# Patient Record
Sex: Female | Born: 1950 | Race: Black or African American | Hispanic: No | Smoking: Former smoker
Health system: Southern US, Community
[De-identification: ages and names within clinical notes are randomized; demographics above are authoritative.]

## PROBLEM LIST (undated history)

## (undated) DIAGNOSIS — M109 Gout, unspecified: Secondary | ICD-10-CM

## (undated) DIAGNOSIS — E785 Hyperlipidemia, unspecified: Secondary | ICD-10-CM

## (undated) DIAGNOSIS — I10 Essential (primary) hypertension: Secondary | ICD-10-CM

## (undated) HISTORY — PX: OTHER SURGICAL HISTORY: SHX169

---

## 2006-12-03 ENCOUNTER — Emergency Department: Payer: Self-pay | Admitting: Emergency Medicine

## 2008-01-23 ENCOUNTER — Emergency Department: Payer: Self-pay | Admitting: Emergency Medicine

## 2009-09-08 ENCOUNTER — Ambulatory Visit: Payer: Self-pay

## 2009-09-30 ENCOUNTER — Ambulatory Visit: Payer: Self-pay | Admitting: General Surgery

## 2009-10-06 ENCOUNTER — Ambulatory Visit: Payer: Self-pay | Admitting: General Surgery

## 2010-09-21 ENCOUNTER — Ambulatory Visit: Payer: Self-pay

## 2010-11-15 ENCOUNTER — Ambulatory Visit: Payer: Self-pay | Admitting: Family

## 2011-09-21 ENCOUNTER — Ambulatory Visit: Payer: Self-pay

## 2011-09-28 ENCOUNTER — Emergency Department: Payer: Self-pay | Admitting: Emergency Medicine

## 2011-12-20 ENCOUNTER — Emergency Department: Payer: Self-pay | Admitting: *Deleted

## 2012-10-31 ENCOUNTER — Ambulatory Visit: Payer: Self-pay

## 2014-07-16 ENCOUNTER — Ambulatory Visit: Payer: Self-pay

## 2015-09-28 ENCOUNTER — Emergency Department
Admission: EM | Admit: 2015-09-28 | Discharge: 2015-09-28 | Disposition: A | Discharge status: Home / Self Care | Payer: Self-pay | Attending: Emergency Medicine | Admitting: Emergency Medicine

## 2015-09-28 ENCOUNTER — Emergency Department: Payer: Self-pay

## 2015-09-28 ENCOUNTER — Encounter: Payer: Self-pay | Admitting: Emergency Medicine

## 2015-09-28 DIAGNOSIS — I10 Essential (primary) hypertension: Secondary | ICD-10-CM | POA: Insufficient documentation

## 2015-09-28 DIAGNOSIS — I8390 Asymptomatic varicose veins of unspecified lower extremity: Secondary | ICD-10-CM

## 2015-09-28 DIAGNOSIS — I8392 Asymptomatic varicose veins of left lower extremity: Secondary | ICD-10-CM | POA: Insufficient documentation

## 2015-09-28 HISTORY — DX: Essential (primary) hypertension: I10

## 2015-09-28 NOTE — ED Provider Notes (Signed)
[facility] Emergency Department Provider Note  ____________________________________________  Time seen: Approximately 2:16 PM  I have reviewed the triage vital signs and the nursing notes.   HISTORY  Chief Complaint Leg Swelling    HPI Wanda Fernandez is a 65 y.o. female a previous history of high blood pressure currently on Norvasc and atenolol.  For about the last 2 weeks she isa small area in the left calf is a little swollen, occasionally slightly bluish in color. She is not having any pain. She has not had a recent surgery and never has a history of blood clot. No shortness of breath chest pain or other concerns.  No weakness numbness or cool foot or toes.   Past Medical History  Diagnosis Date  . Hypertension     There are no active problems to display for this patient.   History reviewed. No pertinent past surgical history.  No current outpatient prescriptions on file.  Allergies Review of patient's allergies indicates no known allergies.  No family history on file.  Social History Social History  Substance Use Topics  . Smoking status: None  . Smokeless tobacco: None  . Alcohol Use: None    Review of Systems Constitutional: No fever/chills Eyes: No visual changes. ENT: No sore throat. Cardiovascular: Denies chest pain. Respiratory: Denies shortness of breath. Gastrointestinal: No abdominal pain.  No nausea, no vomiting.  No diarrhea.  No constipation. Genitourinary: Negative for dysuria. Musculoskeletal: Negative for back pain. Skin: Negative for rash. Neurological: Negative for headaches, focal weakness or numbness.  10-point ROS otherwise negative.  ____________________________________________   PHYSICAL EXAM:  VITAL SIGNS: ED Triage Vitals  Enc Vitals Group     BP 09/28/15 1013 160/118 mmHg     Pulse Rate 09/28/15 1013 60     Resp 09/28/15 1013 16     Temp 09/28/15 1013 97.9 F (36.6 C)     Temp Source  09/28/15 1013 Oral     SpO2 09/28/15 1013 99 %     Weight 09/28/15 1013 210 lb (95.255 kg)     Height 09/28/15 1013  (1.575 m)     Head Cir --      Peak Flow --      Pain Score 09/28/15 1015 0     Pain Loc --      Pain Edu? --      Excl. in GC? --    Constitutional: Alert and oriented. Well appearing and in no acute distress. Very pleasant. Eyes: Conjunctivae are normal. PERRL. EOMI. Head: Atraumatic. Nose: No congestion/rhinnorhea. Mouth/Throat: Mucous membranes are moist.  Oropharynx non-erythematous. Neck: No stridor.   Cardiovascular: Normal rate, regular rhythm. Grossly normal heart sounds.  Good peripheral circulation. Respiratory: Normal respiratory effort.  No retractions. Lungs CTAB. Gastrointestinal: Soft and nontender. No distention.  Musculoskeletal:  Lower Extremities  No edema. Normal DP/PT pulses bilateral with good cap refill.  Normal neuro-motor function lower extremities bilateral.  RIGHT Right lower extremity demonstrates normal strength, good use of all muscles. No edema bruising or contusions of the right hip, right knee, right ankle. Full range of motion of the right lower extremity without pain. No pain on axial loading. No evidence of trauma.  LEFT Left lower extremity demonstrates normal strength, good use of all muscles. No edema bruising or contusions of the hip,  knee, ankle. Full range of motion of the left lower extremity without pain. No pain on axial loading. No evidence of trauma. The patient does have a  small area about the size of a half-dollar that is slightly indurated, but not erythematous or tender. It has a very slight bluish hue to it overlying the anterior medial aspect of the left lower leg, and appears to be consistent with a probable varicose-type pain or small cyst.    Neurologic:  Normal speech and language. No gross focal neurologic deficits are appreciated. No gait instability. Skin:  Skin is warm, dry and intact. No rash  noted. Psychiatric: Mood and affect are normal. Speech and behavior are normal.  ____________________________________________   LABS (all labs ordered are listed, but only abnormal results are displayed)  Labs Reviewed - No data to display ____________________________________________  EKG   ____________________________________________  RADIOLOGY  venous systems from the level of the common femoral vein and including the common femoral, femoral, profunda femoral, popliteal and calf veins including the posterior tibial, peroneal and gastrocnemius veins when visible. The superficial great saphenous vein was also interrogated. Spectral Doppler was utilized to evaluate flow at rest and with distal augmentation maneuvers in the common femoral, femoral and popliteal veins.  COMPARISON: None.  FINDINGS: Contralateral Common Femoral Vein: Respiratory phasicity is normal and symmetric with the symptomatic side. No evidence of thrombus. Normal compressibility.  Common Femoral Vein: No evidence of thrombus. Normal compressibility, respiratory phasicity and response to augmentation.  Saphenofemoral Junction: No evidence of thrombus. Normal compressibility and flow on color Doppler imaging.  Profunda Femoral Vein: No evidence of thrombus. Normal compressibility and flow on color Doppler imaging.  Femoral Vein: No evidence of thrombus. Normal compressibility, respiratory phasicity and response to augmentation.  Popliteal Vein: No evidence of thrombus. Normal compressibility, respiratory phasicity and response to augmentation.  Calf Veins: No evidence of thrombus. Normal compressibility and flow on color Doppler imaging.  Superficial Great Saphenous Vein: No evidence of thrombus. Normal compressibility and flow on color Doppler imaging.  Venous Reflux: None.  Other Findings: None.  IMPRESSION: No evidence of deep venous  thrombosis.  ____________________________________________   PROCEDURES  Procedure(s) performed: None  Critical Care performed: No  ____________________________________________   INITIAL IMPRESSION / ASSESSMENT AND PLAN / ED COURSE  Pertinent labs & imaging results that were available during my care of the patient were reviewed by me and considered in my medical decision making (see chart for details).  Patient noted to have mild to moderate hypertension. She is following up with her doctor on May 10 regarding this and currently on treatment. No symptoms of hypertension.   Regarding her left lower leg, exam seems to be most consistent with a small soft tissue cyst versus varicose vein. She does not appear to have any evidence of DVT and her ultrasound does not demonstrate DVT. There is no evidence or signs of infection, ischemic change, or neurologic/sensory deficit.   Return precautions given. Patient will follow-up closely with her doctor which she has appointment on May 10.  ____________________________________________   FINAL CLINICAL IMPRESSION(S) / ED DIAGNOSES  Final diagnoses:  Varicose vein of leg      Sharyn CreamerMark Raley Novicki, MD 09/28/15 1420

## 2015-09-28 NOTE — Discharge Instructions (Signed)
Varicose Veins Varicose veins are veins that have become enlarged and twisted. They are usually seen in the legs but can occur in other parts of the body as well. CAUSES This condition is the result of valves in the veins not working properly. Valves in the veins help to return blood from the leg to the heart. If these valves are damaged, blood flows backward and backs up into the veins in the leg near the skin. This causes the veins to become larger. RISK FACTORS People who are on their feet a lot, who are pregnant, or who are overweight are more likely to develop varicose veins. SIGNS AND SYMPTOMS  Bulging, twisted-appearing, bluish veins, most commonly found on the legs.  Leg pain or a feeling of heaviness. These symptoms may be worse at the end of the day.  Leg swelling.  Changes in skin color. DIAGNOSIS A health care provider can usually diagnose varicose veins by examining your legs. Your health care provider may also recommend an ultrasound of your leg veins. TREATMENT Most varicose veins can be treated at home.However, other treatments are available for people who have persistent symptoms or want to improve the cosmetic appearance of the varicose veins. These treatment options include:  Sclerotherapy. A solution is injected into the vein to close it off.  Laser treatment. A laser is used to heat the vein to close it off.  Radiofrequency vein ablation. An electrical current produced by radio waves is used to close off the vein.  Phlebectomy. The vein is surgically removed through small incisions made over the varicose vein.  Vein ligation and stripping. The vein is surgically removed through incisions made over the varicose vein after the vein has been tied (ligated). HOME CARE INSTRUCTIONS  Do not stand or sit in one position for long periods of time. Do not sit with your legs crossed. Rest with your legs raised during the day.  Wear compression stockings as directed by your  health care provider. These stockings help to prevent blood clots and reduce swelling in your legs.  Do not wear other tight, encircling garments around your legs, pelvis, or waist.  Walk as much as possible to increase blood flow.  Raise the foot of your bed at night with 2-inch blocks.  If you get a cut in the skin over the vein and the vein bleeds, lie down with your leg raised and press on it with a clean cloth until the bleeding stops. Then place a bandage (dressing) on the cut. See your health care provider if it continues to bleed. SEEK MEDICAL CARE IF:  The skin around your ankle starts to break down.  You have pain, redness, tenderness, or hard swelling in your leg over a vein.  You are uncomfortable because of leg pain.   This information is not intended to replace advice given to you by your health care provider. Make sure you discuss any questions you have with your health care provider.   Document Released: 02/23/2005 Document Revised: 06/06/2014 Document Reviewed: 10/01/2013 Elsevier Interactive Patient Education 2016 [employer]  

## 2015-09-28 NOTE — ED Notes (Signed)
Pt reports pain and swelling to left lower leg x2 weeks; reports swelling has gotten worse. Pt denies recent discontinuation of anticoagulants and denies any recent surgeries.

## 2015-09-28 NOTE — ED Notes (Signed)
Pt informed to return if any life threatening symptoms occur.  

## 2016-12-10 IMAGING — US US EXTREM LOW VENOUS*L*
1 series · 13 of 24 positions shown · non-contrast
Comparison: None.

CLINICAL DATA: 65-year-old female with left lower extremity
swelling and pain for the past 2 weeks



[Series 1: us extrem low venous*left* · 0.07mm/px · 13 of 34 slices shown]
[im 1/34]
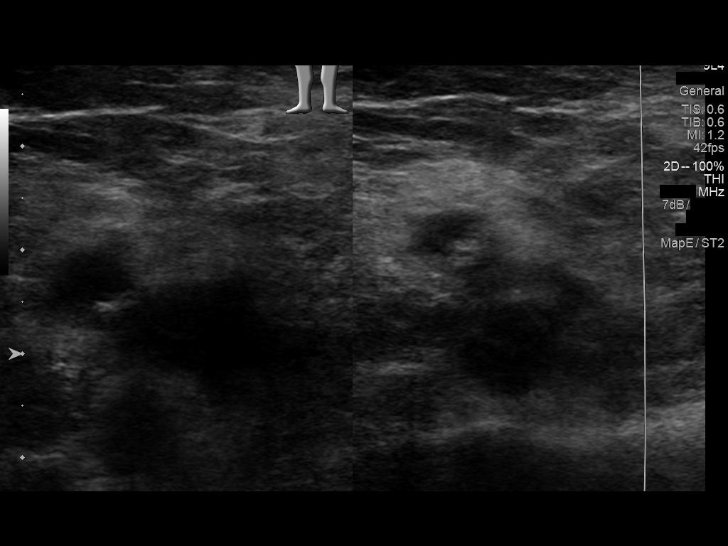
[im 3/34]
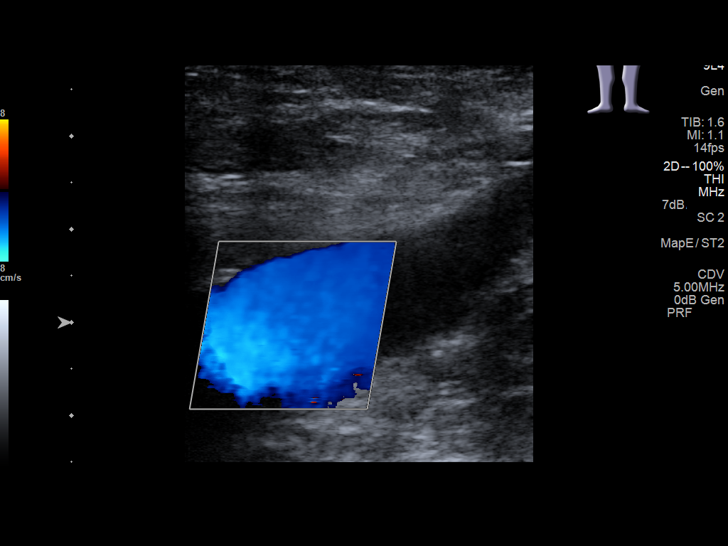
[im 6/34]
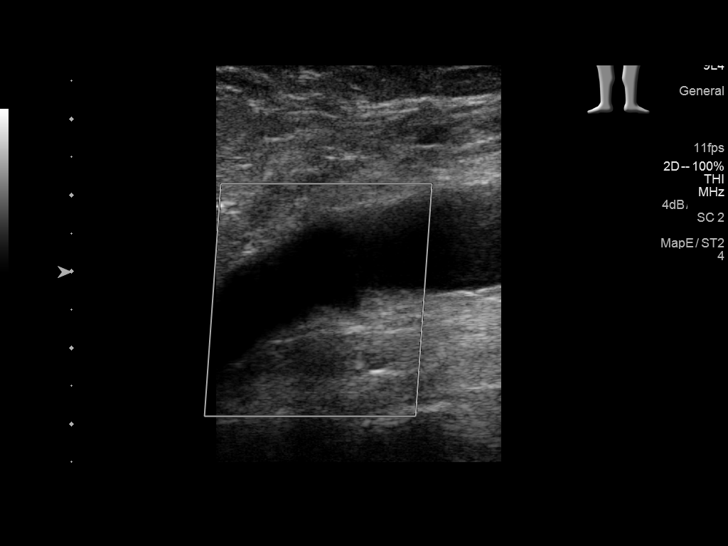
[im 9/34]
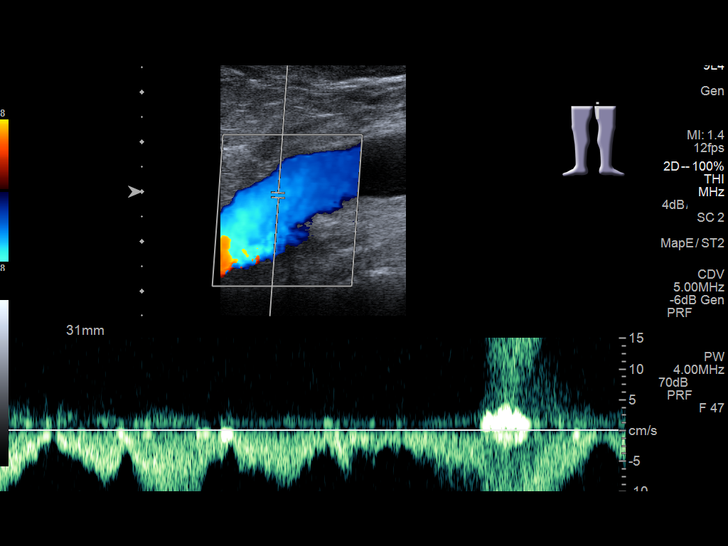
[im 12/34]
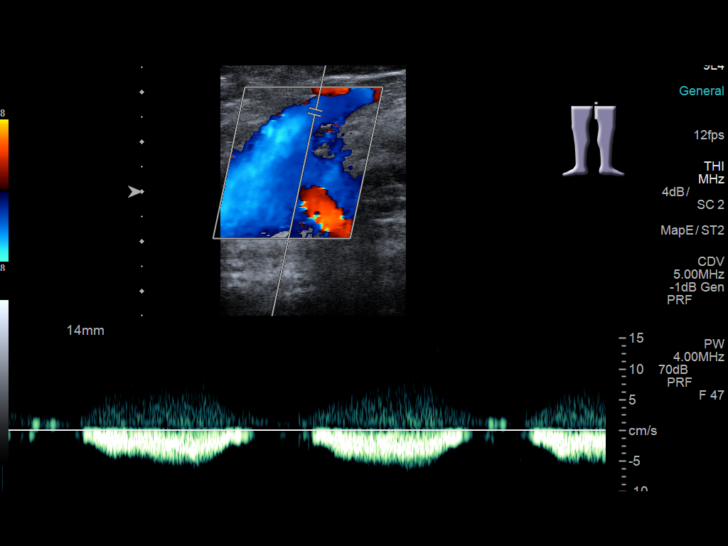
[im 15/34]
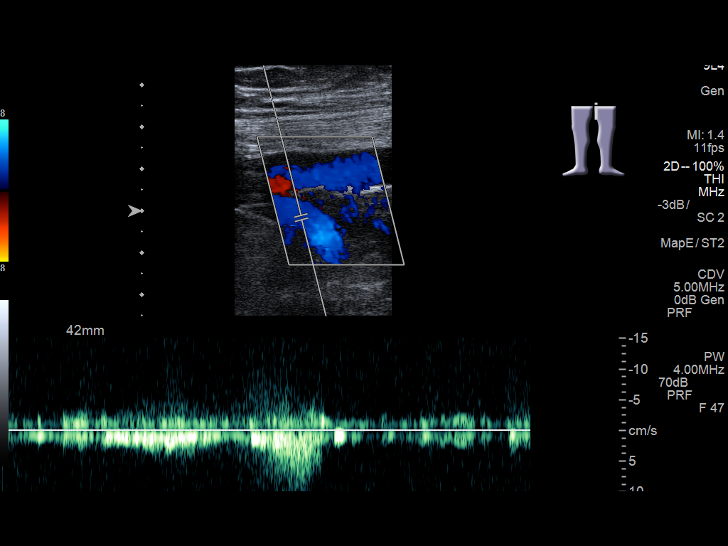
[im 18/34]
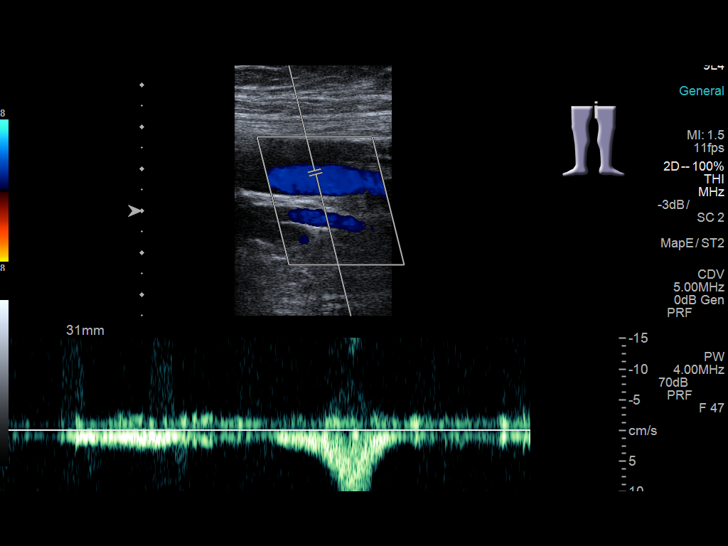
[im 19/34]
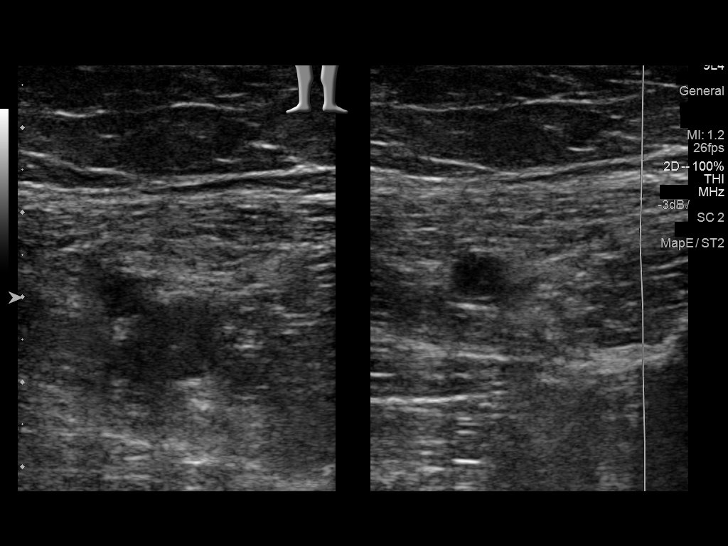
[im 22/34]
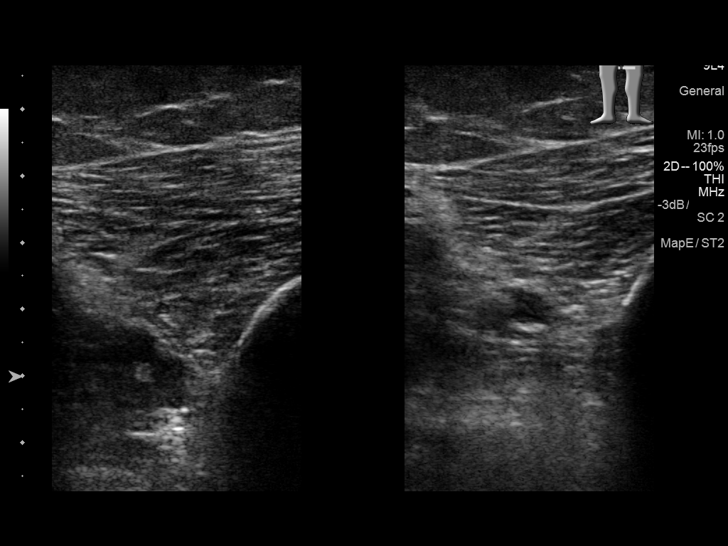
[im 25/34]
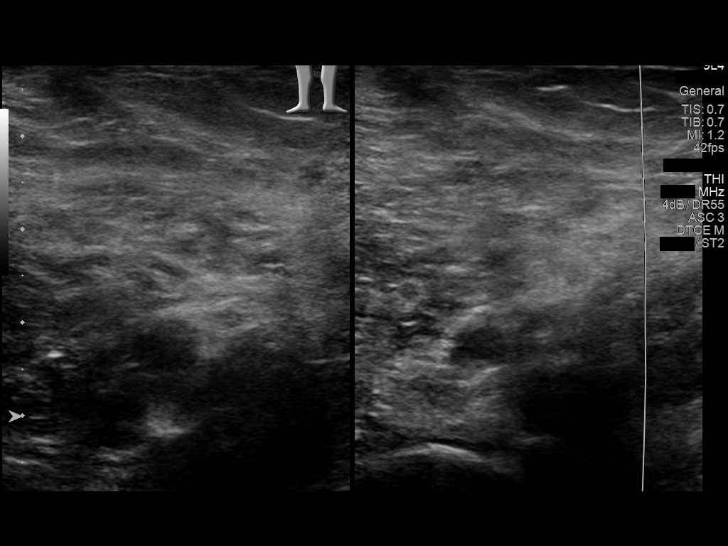
[im 28/34]
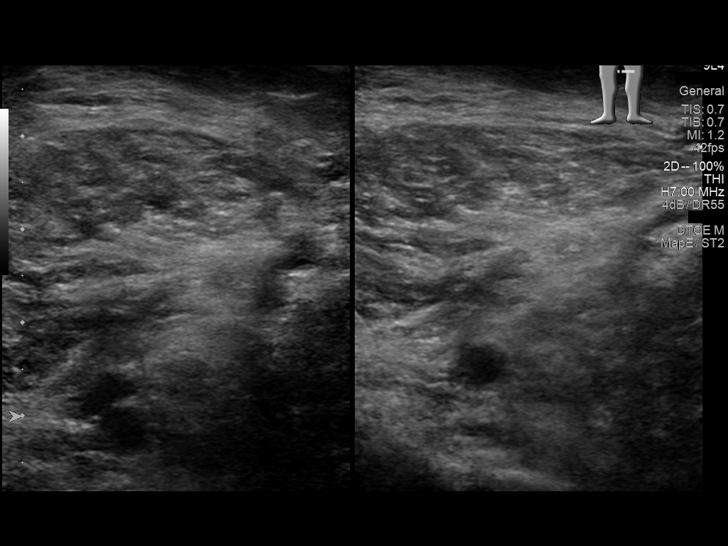
[im 31/34]
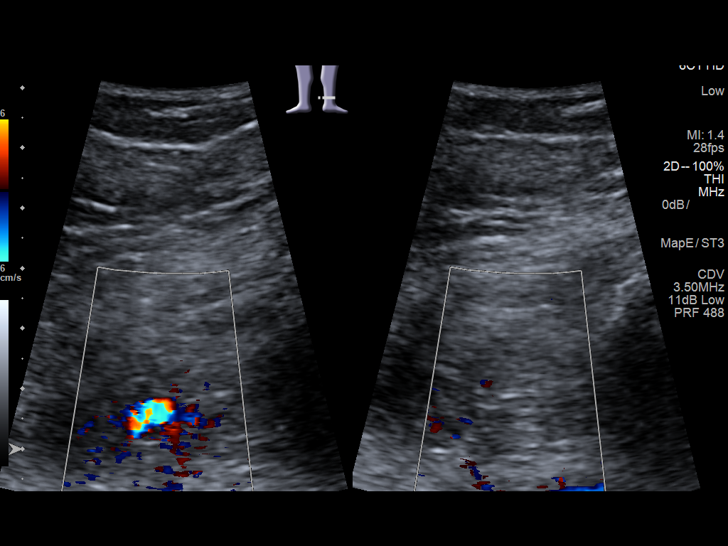
[im 34/34]
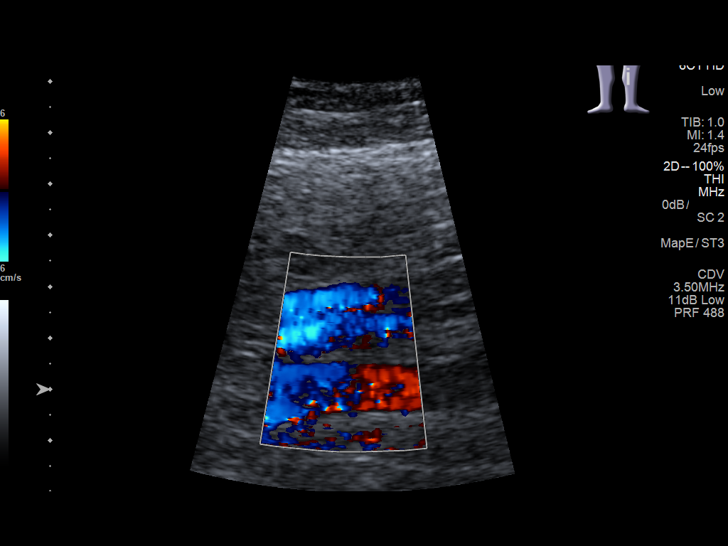

[13 of 24 positions shown; findings below may reference images not displayed]

FINDINGS: Contralateral Common Femoral Vein: Respiratory phasicity is normal
and symmetric with the symptomatic side. No evidence of thrombus.
Normal compressibility.

Common Femoral Vein: No evidence of thrombus. Normal
compressibility, respiratory phasicity and response to augmentation.

Saphenofemoral Junction: No evidence of thrombus. Normal
compressibility and flow on color Doppler imaging.

Profunda Femoral Vein: No evidence of thrombus. Normal
compressibility and flow on color Doppler imaging.

Femoral Vein: No evidence of thrombus. Normal compressibility,
respiratory phasicity and response to augmentation.

Popliteal Vein: No evidence of thrombus. Normal compressibility,
respiratory phasicity and response to augmentation.

Calf Veins: No evidence of thrombus. Normal compressibility and flow
on color Doppler imaging.

Superficial Great Saphenous Vein: No evidence of thrombus. Normal
compressibility and flow on color Doppler imaging.

Venous Reflux:  None.

Other Findings:  None.
IMPRESSION: No evidence of deep venous thrombosis.

## 2017-06-10 DIAGNOSIS — R040 Epistaxis: Secondary | ICD-10-CM | POA: Insufficient documentation

## 2017-06-10 DIAGNOSIS — I10 Essential (primary) hypertension: Secondary | ICD-10-CM | POA: Insufficient documentation

## 2017-06-10 DIAGNOSIS — Z79899 Other long term (current) drug therapy: Secondary | ICD-10-CM | POA: Insufficient documentation

## 2017-06-10 DIAGNOSIS — R51 Headache: Secondary | ICD-10-CM | POA: Insufficient documentation

## 2017-06-10 DIAGNOSIS — Z87891 Personal history of nicotine dependence: Secondary | ICD-10-CM | POA: Insufficient documentation

## 2017-06-10 LAB — BASIC METABOLIC PANEL
Anion gap: 10 (ref 5–15)
BUN: 13 mg/dL (ref 6–20)
CALCIUM: 9.7 mg/dL (ref 8.9–10.3)
CO2: 26 mmol/L (ref 22–32)
CREATININE: 0.86 mg/dL (ref 0.44–1.00)
Chloride: 101 mmol/L (ref 101–111)
GFR calc Af Amer: >60 mL/min (ref >60)
GFR calc non Af Amer: >60 mL/min (ref >60)
GLUCOSE: 119 mg/dL — AB (ref 65–99)
Potassium: 3.7 mmol/L (ref 3.5–5.1)
Sodium: 137 mmol/L (ref 135–145)

## 2017-06-10 LAB — CBC
HCT: 45.3 % (ref 35.0–47.0)
HEMOGLOBIN: 14.9 g/dL (ref 12.0–16.0)
MCH: 26.6 pg (ref 26.0–34.0)
MCHC: 33 g/dL (ref 32.0–36.0)
MCV: 80.7 fL (ref 80.0–100.0)
Platelets: 289 10*3/uL (ref 150–440)
RBC: 5.61 MIL/uL — ABNORMAL HIGH (ref 3.80–5.20)
RDW: 13.6 % (ref 11.5–14.5)
WBC: 9 10*3/uL (ref 3.6–11.0)

## 2017-06-10 LAB — TROPONIN I

## 2017-06-10 NOTE — ED Triage Notes (Signed)
Patient states that she thinks that her blood pressure is high. Patient states that she has been out of her blood pressure medication times three days. Patient states that she is having headaches and that her nose has been bleeding. Patient nose is not currently bleeding at this time.

## 2017-06-11 ENCOUNTER — Emergency Department
Admission: EM | Admit: 2017-06-11 | Discharge: 2017-06-11 | Disposition: A | Discharge status: Home / Self Care | Payer: Self-pay | Attending: Emergency Medicine | Admitting: Emergency Medicine

## 2017-06-11 DIAGNOSIS — R040 Epistaxis: Secondary | ICD-10-CM

## 2017-06-11 DIAGNOSIS — Z760 Encounter for issue of repeat prescription: Secondary | ICD-10-CM

## 2017-06-11 DIAGNOSIS — R51 Headache: Secondary | ICD-10-CM

## 2017-06-11 DIAGNOSIS — R519 Headache, unspecified: Secondary | ICD-10-CM

## 2017-06-11 HISTORY — DX: Gout, unspecified: M10.9

## 2017-06-11 HISTORY — DX: Hyperlipidemia, unspecified: E78.5

## 2017-06-11 MED ORDER — LISINOPRIL 10 MG PO TABS
10.0000 mg | ORAL_TABLET | Freq: Once | ORAL | Status: AC
  Administered 2017-06-11: 10 mg via ORAL
  Filled 2017-06-11: qty 1

## 2017-06-11 MED ORDER — ATENOLOL 25 MG PO TABS
100.0000 mg | ORAL_TABLET | Freq: Once | ORAL | Status: AC
  Administered 2017-06-11: 100 mg via ORAL
  Filled 2017-06-11: qty 4

## 2017-06-11 NOTE — ED Notes (Signed)
Pt with nose clip in upon this RN's arrival to room. Pt asked to remove clip so that we may see if her nose is still bleeding. No bleeding noticed at this time.

## 2017-06-11 NOTE — ED Provider Notes (Signed)
[facility] Emergency Department Provider Note  ____________________________________________   First MD Initiated Contact with Patient 06/11/17 0130     (approximate)  I have reviewed the triage vital signs and the nursing notes.   HISTORY  Chief Complaint Hypertension; Headache; and Epistaxis   HPI Wanda Fernandez is a 67 y.o. female who comes to the emergency department with atraumatic nosebleed.  She has no history of the same.  She has a long-standing history of hypertension and has been out of her medications for 3 days because she forgot to fill them.  She also reports gradual onset not maximal onset bifrontal throbbing headache similar to previous headaches.  She denies numbness or weakness.  Her symptoms had gradual onset slowly progressive.  She is currently in no pain.  She is currently not bleeding.  Nothing seemed to make her symptoms better or worse.  Past Medical History:  Diagnosis Date  . Gout   . Hyperlipemia   . Hypertension     There are no active problems to display for this patient.   History reviewed. No pertinent surgical history.  Prior to Admission medications   Not on File    Allergies Patient has no known allergies.  No family history on file.  Social History Social History   Tobacco Use  . Smoking status: Former Games developer  . Smokeless tobacco: Never Used  Substance Use Topics  . Alcohol use: No    Frequency: Never  . Drug use: No    Review of Systems Constitutional: No fever/chills Eyes: No visual changes. ENT: Positive for nose bleed Cardiovascular: Denies chest pain. Respiratory: Denies shortness of breath. Gastrointestinal: No abdominal pain.  No nausea, no vomiting.  No diarrhea.  No constipation. Genitourinary: Negative for dysuria. Musculoskeletal: Negative for back pain. Skin: Negative for rash. Neurological: Positive for headache   ____________________________________________   PHYSICAL  EXAM:  VITAL SIGNS: ED Triage Vitals  Enc Vitals Group     BP 06/10/17 2210 (!) 216/80     Pulse Rate 06/10/17 2210 97     Resp 06/10/17 2210 18     Temp 06/10/17 2210 (!) 97.5 F (36.4 C)     Temp Source 06/10/17 2210 Oral     SpO2 06/10/17 2210 100 %     Weight 06/10/17 2210 210 lb (95.3 kg)     Height 06/10/17 2210 5\' 2"  (1.575 m)     Head Circumference --      Peak Flow --      Pain Score 06/11/17 0116 0     Pain Loc --      Pain Edu? --      Excl. in GC? --     Constitutional: Alert and oriented x4 joking laughing well-appearing nontoxic no diaphoresis speaks in full clear sentences Eyes: PERRL EOMI. Head: Atraumatic. Nose: No active bleeding.  Evidence of recent anterior epistaxis on the left Mouth/Throat: No trismus Neck: No stridor.   Cardiovascular: Normal rate, regular rhythm. Grossly normal heart sounds.  Good peripheral circulation. Respiratory: Normal respiratory effort.  No retractions. Lungs CTAB and moving good air Gastrointestinal: Soft nontender Musculoskeletal: No lower extremity edema   Neurologic:  Normal speech and language. No gross focal neurologic deficits are appreciated. Skin:  Skin is warm, dry and intact. No rash noted. Psychiatric: Mood and affect are normal. Speech and behavior are normal.    ____________________________________________   DIFFERENTIAL includes but not limited to  Anterior epistaxis, posterior epistaxis, tension headache, migraine headache,  medication noncompliance, hypertensive encephalopathy ____________________________________________   LABS (all labs ordered are listed, but only abnormal results are displayed)  Labs Reviewed  BASIC METABOLIC PANEL - Abnormal; Notable for the following components:      Result Value   Glucose, Bld 119 (*)    All other components within normal limits  CBC - Abnormal; Notable for the following components:   RBC 5.61 (*)    All other components within normal limits  TROPONIN I     Work reviewed by me with no acute disease __________________________________________  EKG  ED ECG REPORT I, Merrily BrittleNeil Pamula Luther, the attending physician, personally viewed and interpreted this ECG.  Date: 06/11/2017 EKG Time:  Rate: 90 Rhythm: normal sinus rhythm QRS Axis: normal Intervals: normal ST/T Wave abnormalities: normal Narrative Interpretation: no evidence of acute ischemia.  Poor R wave progression  ____________________________________________  RADIOLOGY   ____________________________________________   PROCEDURES  Procedure(s) performed: no  Procedures  Critical Care performed: no  Observation: no ____________________________________________   INITIAL IMPRESSION / ASSESSMENT AND PLAN / ED COURSE  Pertinent labs & imaging results that were available during my care of the patient were reviewed by me and considered in my medical decision making (see chart for details).  By the time I saw the patient her symptoms have resolved.  She does have evidence of anterior epistaxis recently on the left.  No posterior involvement.  Headache is without red flags.  She is unable to fill her atenolol and lisinopril until Monday.  I will give her a dose for home for tomorrow.  And a lengthy discussion with the patient regarding the importance of follow-up.  Strict return precautions given and patient verbalized understanding agreement plan.      ____________________________________________   FINAL CLINICAL IMPRESSION(S) / ED DIAGNOSES  Final diagnoses:  Anterior epistaxis  Nonintractable headache, unspecified chronicity pattern, unspecified headache type  Medication refill      NEW MEDICATIONS STARTED DURING THIS VISIT:  There are no discharge medications for this patient.    Note:  This document was prepared using Dragon voice recognition software and may include unintentional dictation errors.     Merrily Brittleifenbark, Mauro Arps, MD 06/11/17 [phone]

## 2017-06-11 NOTE — Discharge Instructions (Signed)
Please fill your prescriptions this coming Monday.  Follow-up with your primary care physician as needed and return to the emergency department for any concerns.  It was a pleasure to take care of you today, and thank you for coming to our emergency department.  If you have any questions or concerns before leaving please ask the nurse to grab me and I'm more than happy to go through your aftercare instructions again.  If you were prescribed any opioid pain medication today such as Norco, Vicodin, Percocet, morphine, hydrocodone, or oxycodone please make sure you do not drive when you are taking this medication as it can alter your ability to drive safely.  If you have any concerns once you are home that you are not improving or are in fact getting worse before you can make it to your follow-up appointment, please do not hesitate to call 911 and come back for further evaluation.  Merrily BrittleNeil Shahan Starks, MD  Results for orders placed or performed during the hospital encounter of 06/11/17  Basic metabolic panel  Result Value Ref Range   Sodium 137 135 - 145 mmol/L   Potassium 3.7 3.5 - 5.1 mmol/L   Chloride 101 101 - 111 mmol/L   CO2 26 22 - 32 mmol/L   Glucose, Bld 119 (H) 65 - 99 mg/dL   BUN 13 6 - 20 mg/dL   Creatinine, Ser 1.610.86 0.44 - 1.00 mg/dL   Calcium 9.7 8.9 - 09.610.3 mg/dL   GFR calc non Af Amer >60 >60 mL/min   GFR calc Af Amer >60 >60 mL/min   Anion gap 10 5 - 15  CBC  Result Value Ref Range   WBC 9.0 3.6 - 11.0 K/uL   RBC 5.61 (H) 3.80 - 5.20 MIL/uL   Hemoglobin 14.9 12.0 - 16.0 g/dL   HCT 04.545.3 40.935.0 - 81.147.0 %   MCV 80.7 80.0 - 100.0 fL   MCH 26.6 26.0 - 34.0 pg   MCHC 33.0 32.0 - 36.0 g/dL   RDW 91.413.6 78.211.5 - 95.614.5 %   Platelets 289 150 - 440 K/uL  Troponin I  Result Value Ref Range   Troponin I <0.03 <0.03 ng/mL

## 2022-07-26 ENCOUNTER — Other Ambulatory Visit: Payer: Self-pay | Admitting: Family Medicine

## 2022-07-26 DIAGNOSIS — Z1231 Encounter for screening mammogram for malignant neoplasm of breast: Secondary | ICD-10-CM

## 2022-08-15 ENCOUNTER — Other Ambulatory Visit (INDEPENDENT_AMBULATORY_CARE_PROVIDER_SITE_OTHER): Payer: Self-pay | Admitting: Vascular Surgery

## 2022-08-15 DIAGNOSIS — R9389 Abnormal findings on diagnostic imaging of other specified body structures: Secondary | ICD-10-CM

## 2022-08-19 ENCOUNTER — Ambulatory Visit (INDEPENDENT_AMBULATORY_CARE_PROVIDER_SITE_OTHER): Payer: Medicare HMO

## 2022-08-19 ENCOUNTER — Ambulatory Visit (INDEPENDENT_AMBULATORY_CARE_PROVIDER_SITE_OTHER): Payer: Medicare HMO | Admitting: Vascular Surgery

## 2022-08-19 ENCOUNTER — Encounter (INDEPENDENT_AMBULATORY_CARE_PROVIDER_SITE_OTHER): Payer: Self-pay | Admitting: Vascular Surgery

## 2022-08-19 VITALS — BP 190/67 | HR 45 | Resp 18 | Ht 61.0 in | Wt 195.0 lb

## 2022-08-19 DIAGNOSIS — R9389 Abnormal findings on diagnostic imaging of other specified body structures: Secondary | ICD-10-CM

## 2022-08-19 DIAGNOSIS — M79609 Pain in unspecified limb: Secondary | ICD-10-CM | POA: Insufficient documentation

## 2022-08-19 DIAGNOSIS — M79605 Pain in left leg: Secondary | ICD-10-CM

## 2022-08-19 DIAGNOSIS — I10 Essential (primary) hypertension: Secondary | ICD-10-CM

## 2022-08-19 DIAGNOSIS — E785 Hyperlipidemia, unspecified: Secondary | ICD-10-CM

## 2022-08-19 NOTE — Progress Notes (Signed)
Patient ID: Wanda Fernandez, female   DOB: Jan 01, 1951, 72 y.o.   MRN: [id number]  Chief Complaint  Patient presents with   New Patient (Initial Visit)    np. ABI + consult. PAD + on at home screening test. htn. referred by aycock, ngwe    HPI Wanda Fernandez is a 72 y.o. female.  I am asked to see the patient by Dr. Clide Deutscher for evaluation of left leg pain and numbness with abnormal home health PAD screening test.  Patient describes a burning and tingling sensation down her left lower leg and upper leg.  This radiates down the outside of the leg.  No previous trauma or injury to the leg.  No significant right leg symptoms.  At home health screening study suggesting significant PAD so she is referred for further evaluation and treatment.  To further evaluate her situation, noninvasive studies were performed in our office today.  These demonstrated normal triphasic waveforms with a right ABI of 1.10 and a left ABI 1.07 with normal digital pressures and waveforms bilaterally consistent with no arterial insufficiency.     Past Medical History:  Diagnosis Date   Gout    Hyperlipemia    Hypertension     History reviewed. No pertinent surgical history.   Family History No bleeding disorders, clotting disorders, autoimmune diseases or aneurysms   Social History   Tobacco Use   Smoking status: Former   Smokeless tobacco: Never  Substance Use Topics   Alcohol use: No   Drug use: No     Allergies  Allergen Reactions   Lisinopril Cough    Current Outpatient Medications  Medication Sig Dispense Refill   amLODipine (NORVASC) 10 MG tablet Take 10 mg by mouth daily.     atenolol (TENORMIN) 50 MG tablet Take 50 mg by mouth daily.     atorvastatin (LIPITOR) 10 MG tablet Take 1 tablet by mouth daily.     Calcium Carbonate-Vitamin D 600-3.125 MG-MCG TABS Take by mouth.     indomethacin (INDOCIN) 25 MG capsule Take 25 mg by mouth 2 (two) times daily with a meal.      losartan-hydrochlorothiazide (HYZAAR) 100-25 MG tablet Take 1 tablet by mouth daily.     Multiple Vitamin (MULTIVITAMIN) capsule Take 1 capsule by mouth daily.     No current facility-administered medications for this visit.      REVIEW OF SYSTEMS (Negative unless checked)  Constitutional: [] Weight loss  [] Fever  [] Chills Cardiac: [] Chest pain   [] Chest pressure   [] Palpitations   [] Shortness of breath when laying flat   [] Shortness of breath at rest   [] Shortness of breath with exertion. Vascular:  [] Pain in legs with walking   [] Pain in legs at rest   [] Pain in legs when laying flat   [] Claudication   [] Pain in feet when walking  [] Pain in feet at rest  [] Pain in feet when laying flat   [] History of DVT   [] Phlebitis   [] Swelling in legs   [] Varicose veins   [] Non-healing ulcers Pulmonary:   [] Uses home oxygen   [] Productive cough   [] Hemoptysis   [] Wheeze  [] COPD   [] Asthma Neurologic:  [] Dizziness  [] Blackouts   [] Seizures   [] History of stroke   [] History of TIA  [] Aphasia   [] Temporary blindness   [] Dysphagia   [] Weakness or numbness in arms   [x] Weakness or numbness in legs Musculoskeletal:  [x] Arthritis   [] Joint swelling   [x] Joint pain   [] Low back  pain Hematologic:  [] Easy bruising  [] Easy bleeding   [] Hypercoagulable state   [] Anemic  [] Hepatitis Gastrointestinal:  [] Blood in stool   [] Vomiting blood  [] Gastroesophageal reflux/heartburn   [] Abdominal pain Genitourinary:  [] Chronic kidney disease   [] Difficult urination  [] Frequent urination  [] Burning with urination   [] Hematuria Skin:  [] Rashes   [] Ulcers   [] Wounds Psychological:  [] History of anxiety   []  History of major depression.    Physical Exam BP (!) 190/67 (BP Location: Right Arm)   Pulse (!) 45   Resp 18   Ht 5\' 1"  (1.549 m)   Wt 195 lb (88.5 kg)   SpO2 (!) 18%   BMI 36.84 kg/m  Gen:  WD/WN, NAD Head: [place]/AT, No temporalis wasting. Prominent temp pulse not noted. Ear/Nose/Throat: Hearing grossly intact, nares  w/o erythema or drainage, oropharynx w/o Erythema/Exudate Eyes: Conjunctiva clear, sclera non-icteric  Neck: trachea midline.  No JVD.  Pulmonary:  Good air movement, respirations not labored, no use of accessory muscles  Cardiac: Bradycardic Vascular:  Vessel Right Left  Radial Palpable Palpable                          PT Palpable Palpable  DP Palpable Palpable   Gastrointestinal:. No masses, surgical incisions, or scars. Musculoskeletal: M/S 5/5 throughout.  Extremities without ischemic changes.  No deformity or atrophy.  No edema. Neurologic: Sensation grossly intact in extremities.  Symmetrical.  Speech is fluent. Motor exam as listed above. Psychiatric: Judgment intact, Mood & affect appropriate for pt's clinical situation. Dermatologic: No rashes or ulcers noted.  No cellulitis or open wounds.    Radiology No results found.  Labs No results found for this or any previous visit (from the past 2160 hour(s)).  Assessment/Plan:  Pain in limb These demonstrated normal triphasic waveforms with a right ABI of 1.10 and a left ABI 1.07 with normal digital pressures and waveforms bilaterally consistent with no arterial insufficiency.  Her lower extremity symptoms or not related to arterial insufficiency.  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  No further vascular workup is planned at this time.  Hypertension blood pressure control important in reducing the progression of atherosclerotic disease. On appropriate oral medications.   Hyperlipemia lipid control important in reducing the progression of atherosclerotic disease. Continue statin therapy      Leotis Pain 08/19/2022, 11:29 AM   This note was created with Dragon medical transcription system.  Any errors from dictation are unintentional.

## 2022-08-19 NOTE — Assessment & Plan Note (Signed)
blood pressure control important in reducing the progression of atherosclerotic disease. On appropriate oral medications.  

## 2022-08-19 NOTE — Assessment & Plan Note (Signed)
These demonstrated normal triphasic waveforms with a right ABI of 1.10 and a left ABI 1.07 with normal digital pressures and waveforms bilaterally consistent with no arterial insufficiency.  Her lower extremity symptoms or not related to arterial insufficiency.  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  No further vascular workup is planned at this time.

## 2022-08-19 NOTE — Assessment & Plan Note (Signed)
lipid control important in reducing the progression of atherosclerotic disease. Continue statin therapy  

## 2022-08-24 ENCOUNTER — Ambulatory Visit
Admission: RE | Admit: 2022-08-24 | Discharge: 2022-08-24 | Disposition: A | Discharge status: Home / Self Care | Payer: Medicare HMO | Source: Ambulatory Visit | Attending: Family Medicine | Admitting: Family Medicine

## 2022-08-24 DIAGNOSIS — Z1231 Encounter for screening mammogram for malignant neoplasm of breast: Secondary | ICD-10-CM | POA: Insufficient documentation

## 2023-07-11 ENCOUNTER — Other Ambulatory Visit: Payer: Self-pay | Admitting: Family Medicine

## 2023-07-11 DIAGNOSIS — Z1231 Encounter for screening mammogram for malignant neoplasm of breast: Secondary | ICD-10-CM

## 2023-08-25 ENCOUNTER — Ambulatory Visit
Admission: RE | Admit: 2023-08-25 | Discharge: 2023-08-25 | Disposition: A | Discharge status: Home / Self Care | Payer: Medicare HMO | Source: Ambulatory Visit | Attending: Family Medicine | Admitting: Family Medicine

## 2023-08-25 DIAGNOSIS — Z1231 Encounter for screening mammogram for malignant neoplasm of breast: Secondary | ICD-10-CM | POA: Insufficient documentation

## 2023-08-28 ENCOUNTER — Ambulatory Visit
Admission: RE | Admit: 2023-08-28 | Discharge: 2023-08-28 | Disposition: A | Discharge status: Home / Self Care | Attending: Family Medicine | Admitting: Family Medicine

## 2023-08-28 ENCOUNTER — Ambulatory Visit
Admission: RE | Admit: 2023-08-28 | Discharge: 2023-08-28 | Disposition: A | Discharge status: Home / Self Care | Source: Ambulatory Visit | Attending: Family Medicine | Admitting: Family Medicine

## 2023-08-28 ENCOUNTER — Other Ambulatory Visit: Payer: Self-pay | Admitting: Family Medicine

## 2023-08-28 DIAGNOSIS — M25511 Pain in right shoulder: Secondary | ICD-10-CM | POA: Diagnosis present

## 2023-08-30 ENCOUNTER — Other Ambulatory Visit: Payer: Self-pay | Admitting: Family Medicine

## 2023-08-30 DIAGNOSIS — R928 Other abnormal and inconclusive findings on diagnostic imaging of breast: Secondary | ICD-10-CM

## 2023-08-31 ENCOUNTER — Ambulatory Visit
Admission: RE | Admit: 2023-08-31 | Discharge: 2023-08-31 | Disposition: A | Discharge status: Home / Self Care | Source: Ambulatory Visit | Attending: Family Medicine | Admitting: Family Medicine

## 2023-08-31 DIAGNOSIS — R928 Other abnormal and inconclusive findings on diagnostic imaging of breast: Secondary | ICD-10-CM | POA: Insufficient documentation

## 2023-09-08 ENCOUNTER — Other Ambulatory Visit: Payer: Self-pay | Admitting: Family Medicine

## 2023-09-08 DIAGNOSIS — N63 Unspecified lump in unspecified breast: Secondary | ICD-10-CM

## 2023-09-08 DIAGNOSIS — R928 Other abnormal and inconclusive findings on diagnostic imaging of breast: Secondary | ICD-10-CM

## 2023-09-13 ENCOUNTER — Encounter

## 2023-09-13 ENCOUNTER — Other Ambulatory Visit

## 2023-09-15 ENCOUNTER — Ambulatory Visit
Admission: RE | Admit: 2023-09-15 | Discharge: 2023-09-15 | Disposition: A | Discharge status: Home / Self Care | Source: Ambulatory Visit | Attending: Family Medicine | Admitting: Family Medicine

## 2023-09-15 DIAGNOSIS — R928 Other abnormal and inconclusive findings on diagnostic imaging of breast: Secondary | ICD-10-CM

## 2023-09-15 DIAGNOSIS — N63 Unspecified lump in unspecified breast: Secondary | ICD-10-CM | POA: Diagnosis present

## 2023-09-15 HISTORY — PX: BREAST BIOPSY: SHX20

## 2023-09-15 MED ORDER — LIDOCAINE-EPINEPHRINE 1 %-1:100000 IJ SOLN
8.0000 mL | Freq: Once | INTRAMUSCULAR | Status: AC
  Administered 2023-09-15: 8 mL

## 2023-09-15 MED ORDER — LIDOCAINE 1 % OPTIME INJ - NO CHARGE
2.0000 mL | Freq: Once | INTRAMUSCULAR | Status: AC
  Administered 2023-09-15: 2 mL
  Filled 2023-09-15: qty 2

## 2023-09-18 ENCOUNTER — Encounter

## 2023-09-18 ENCOUNTER — Other Ambulatory Visit

## 2023-09-19 ENCOUNTER — Encounter: Payer: Self-pay | Admitting: *Deleted

## 2023-09-19 LAB — SURGICAL PATHOLOGY

## 2023-09-19 NOTE — Progress Notes (Signed)
 Received referral for newly diagnosed breast cancer from [facility].  Navigation initiated.  Discussed surgeons and oncologists.  She is going to discuss with her daughter and either her or her daughter will call me back.

## 2023-09-20 ENCOUNTER — Encounter: Payer: Self-pay | Admitting: *Deleted

## 2023-09-20 NOTE — Progress Notes (Signed)
 Spoke with daughter Jacklyn to discuss local surgeons and medical oncologists.  She is going to further discuss with her mom and they will let me know where they'd like to be seen.   They are also considering [facility] as an option.

## 2023-09-21 ENCOUNTER — Encounter: Payer: Self-pay | Admitting: *Deleted

## 2023-09-21 NOTE — Progress Notes (Signed)
 I spoke with Wanda Fernandez and her daughter Jacklyn.  They are still deciding which providers she would like to see and will let me know.

## 2023-09-22 ENCOUNTER — Encounter: Payer: Self-pay | Admitting: *Deleted

## 2023-09-22 NOTE — Progress Notes (Signed)
 Wanda Fernandez would like to go to [facility].  Pathology, mammo report and demographics faxed to [facility] breast clinic.
